# Patient Record
Sex: Male | Born: 2000 | Race: Black or African American | Hispanic: No | Marital: Single | Smoking: Never smoker
Health system: Southern US, Community
[De-identification: ages and names within clinical notes are randomized; demographics above are authoritative.]

## PROBLEM LIST (undated history)

## (undated) DIAGNOSIS — Z973 Presence of spectacles and contact lenses: Secondary | ICD-10-CM

## (undated) DIAGNOSIS — A6002 Herpesviral infection of other male genital organs: Secondary | ICD-10-CM

## (undated) DIAGNOSIS — S83206A Unspecified tear of unspecified meniscus, current injury, right knee, initial encounter: Secondary | ICD-10-CM

## (undated) DIAGNOSIS — L309 Dermatitis, unspecified: Secondary | ICD-10-CM

## (undated) DIAGNOSIS — S83207A Unspecified tear of unspecified meniscus, current injury, left knee, initial encounter: Secondary | ICD-10-CM

## (undated) HISTORY — PX: WISDOM TOOTH EXTRACTION: SHX21

---
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
## 2020-03-19 ENCOUNTER — Encounter (HOSPITAL_BASED_OUTPATIENT_CLINIC_OR_DEPARTMENT_OTHER): Payer: Self-pay | Admitting: Emergency Medicine

## 2020-03-19 ENCOUNTER — Other Ambulatory Visit: Payer: Self-pay

## 2020-03-19 ENCOUNTER — Emergency Department (HOSPITAL_BASED_OUTPATIENT_CLINIC_OR_DEPARTMENT_OTHER)
Admission: EM | Admit: 2020-03-19 | Discharge: 2020-03-19 | Disposition: A | Discharge status: Home / Self Care | Payer: Medicaid Other | Attending: Emergency Medicine | Admitting: Emergency Medicine

## 2020-03-19 ENCOUNTER — Emergency Department (HOSPITAL_BASED_OUTPATIENT_CLINIC_OR_DEPARTMENT_OTHER): Payer: Medicaid Other

## 2020-03-19 DIAGNOSIS — J069 Acute upper respiratory infection, unspecified: Secondary | ICD-10-CM | POA: Diagnosis not present

## 2020-03-19 DIAGNOSIS — R0789 Other chest pain: Secondary | ICD-10-CM | POA: Insufficient documentation

## 2020-03-19 DIAGNOSIS — R05 Cough: Secondary | ICD-10-CM | POA: Diagnosis present

## 2020-03-19 DIAGNOSIS — Z20822 Contact with and (suspected) exposure to covid-19: Secondary | ICD-10-CM | POA: Insufficient documentation

## 2020-03-19 LAB — CBC
HCT: 46.7 % (ref 39.0–52.0)
Hemoglobin: 15.3 g/dL (ref 13.0–17.0)
MCH: 28.5 pg (ref 26.0–34.0)
MCHC: 32.8 g/dL (ref 30.0–36.0)
MCV: 87 fL (ref 80.0–100.0)
Platelets: 237 10*3/uL (ref 150–400)
RBC: 5.37 MIL/uL (ref 4.22–5.81)
RDW: 11.7 % (ref 11.5–15.5)
WBC: 12.9 10*3/uL — ABNORMAL HIGH (ref 4.0–10.5)
nRBC: 0 % (ref 0.0–0.2)

## 2020-03-19 LAB — SARS CORONAVIRUS 2 BY RT PCR (HOSPITAL ORDER, PERFORMED IN ~~LOC~~ HOSPITAL LAB): SARS Coronavirus 2: NEGATIVE

## 2020-03-19 LAB — BASIC METABOLIC PANEL
Anion gap: 11 (ref 5–15)
BUN: 7 mg/dL (ref 6–20)
CO2: 24 mmol/L (ref 22–32)
Calcium: 9.2 mg/dL (ref 8.9–10.3)
Chloride: 102 mmol/L (ref 98–111)
Creatinine, Ser: 1.23 mg/dL (ref 0.61–1.24)
GFR calc Af Amer: >60 mL/min (ref >60)
GFR calc non Af Amer: >60 mL/min (ref >60)
Glucose, Bld: 96 mg/dL (ref 70–99)
Potassium: 3.5 mmol/L (ref 3.5–5.1)
Sodium: 137 mmol/L (ref 135–145)

## 2020-03-19 LAB — TROPONIN I (HIGH SENSITIVITY): Troponin I (High Sensitivity): 5 ng/L (ref <18)

## 2020-03-19 MED ORDER — ALBUTEROL SULFATE HFA 108 (90 BASE) MCG/ACT IN AERS: 1.0000 | INHALATION_SPRAY | Freq: Four times a day (QID) | RESPIRATORY_TRACT | 0 refills | Status: DC | PRN

## 2020-03-19 MED ORDER — ALBUTEROL SULFATE HFA 108 (90 BASE) MCG/ACT IN AERS
1.0000 | INHALATION_SPRAY | Freq: Four times a day (QID) | RESPIRATORY_TRACT | Status: DC | PRN
  Administered 2020-03-19: 2 via RESPIRATORY_TRACT
  Filled 2020-03-19: qty 6.7

## 2020-03-19 MED ORDER — BENZONATATE 100 MG PO CAPS: 100.0000 mg | ORAL_CAPSULE | Freq: Three times a day (TID) | ORAL | 0 refills | Status: DC | PRN

## 2020-03-19 NOTE — ED Provider Notes (Signed)
[facility] EMERGENCY DEPARTMENT Provider Note   CSN: [id number] Arrival date & time: 03/19/20  1739     History Chief Complaint  Patient presents with  . Chest Pain    Dominic Ramos is a 19 y.o. male.  HPI Patient is an 19 year old male with no pertinent medical history.  Patient states for the past 2 days he has been experiencing mild congestion, postnasal drip, intermittent cough.  This morning he took a decongestant as well as an antihistamine.  He states that he smoked some marijuana and then went to the fair.  When he came home he was watching TV with his girlfriend in bed and states that he is unsure if he passed out or went to sleep.  He states that she attempted to wake him up but was initially unsuccessful.  She was concerned that something was wrong with him.  He states that he then woke up and experienced severe, stabbing, central chest pain.  He cannot specify how long this occurred.  This then spontaneously resolved and he decided to go back to sleep.  No fevers, chills, abdominal pain, nausea, vomiting, leg swelling, recent immobilization, recent trauma, history of blood clot.    Past Medical History:  Diagnosis Date  . Eczema     There are no problems to display for this patient.   History reviewed. No pertinent surgical history.     No family history on file.  Social History   Tobacco Use  . Smoking status: Never Smoker  . Smokeless tobacco: Never Used  Substance Use Topics  . Alcohol use: Yes    Comment: occasionally  . Drug use: Never    Home Medications Prior to Admission medications   Medication Sig Start Date End Date Taking? Authorizing Provider  ibuprofen (ADVIL,MOTRIN) 600 MG tablet Take 1 tablet (600 mg total) by mouth every 6 (six) hours as needed for mild pain or moderate pain. 02/22/18   Loren Racer, MD  sodium chloride (OCEAN) 0.65 % SOLN nasal spray Place 1 spray into both nostrils as needed for congestion. 06/07/15   Hess,  Nada Boozer, PA-C    Allergies    Patient has no known allergies.  Review of Systems   Review of Systems  All other systems reviewed and are negative. Ten systems reviewed and are negative for acute change, except as noted in the HPI.    Physical Exam Updated Vital Signs BP 128/62 (BP Location: Right Arm)   Pulse 82   Resp (!) 21   Ht 6\' 4"  (1.93 m)   Wt 113.4 kg   SpO2 100%   BMI 30.43 kg/m   Physical Exam Vitals and nursing note reviewed.  Constitutional:      General: He is not in acute distress.    Appearance: Normal appearance. He is well-developed and normal weight. He is not ill-appearing, toxic-appearing or diaphoretic.  HENT:     Head: Normocephalic and atraumatic.     Right Ear: External ear normal.     Left Ear: External ear normal.     Nose: Nose normal.     Mouth/Throat:     Mouth: Mucous membranes are moist.     Pharynx: Oropharynx is clear. No oropharyngeal exudate or posterior oropharyngeal erythema.  Eyes:     Extraocular Movements: Extraocular movements intact.  Cardiovascular:     Rate and Rhythm: Normal rate and regular rhythm.     Pulses: Normal pulses.  Radial pulses are 2+ on the right side and 2+ on the left side.       Dorsalis pedis pulses are 2+ on the right side and 2+ on the left side.     Heart sounds: Normal heart sounds. Heart sounds not distant. No murmur heard.  No systolic murmur is present.  No friction rub. No gallop.   Pulmonary:     Effort: Pulmonary effort is normal. No tachypnea or respiratory distress.     Breath sounds: No stridor. Examination of the right-lower field reveals wheezing. Examination of the left-lower field reveals wheezing. Wheezing present. No decreased breath sounds, rhonchi or rales.  Abdominal:     General: Abdomen is flat.     Palpations: Abdomen is soft. There is no mass.     Tenderness: There is no abdominal tenderness. There is no guarding or rebound.  Musculoskeletal:        General: Normal  range of motion.     Cervical back: Normal range of motion and neck supple. No tenderness.     Right lower leg: No tenderness. No edema.     Left lower leg: No tenderness. No edema.     Comments: No leg swelling noted.  No calf tenderness.  Skin:    General: Skin is warm and dry.  Neurological:     General: No focal deficit present.     Mental Status: He is alert and oriented to person, place, and time.  Psychiatric:        Mood and Affect: Mood normal.        Behavior: Behavior normal.    ED Results / Procedures / Treatments   Labs (all labs ordered are listed, but only abnormal results are displayed) Labs Reviewed  CBC - Abnormal; Notable for the following components:      Result Value   WBC 12.9 (*)    All other components within normal limits  SARS CORONAVIRUS 2 BY RT PCR (HOSPITAL ORDER, PERFORMED IN [facility])  BASIC METABOLIC PANEL  TROPONIN I (HIGH SENSITIVITY)   EKG EKG Interpretation  Date/Time:  Sunday March 19 2020 17:51:26 EDT Ventricular Rate:  98 PR Interval:  132 QRS Duration: 92 QT Interval:  332 QTC Calculation: 423 R Axis:   84 Text Interpretation: Normal sinus rhythm Normal ECG No previous ECGs available Confirmed by Richardean Canal [phone]) on 03/19/2020 8:43:41 PM   Radiology DG Chest 2 View  Result Date: 03/19/2020 CLINICAL DATA:  Posterior right chest pain and shortness of EXAM: CHEST - 2 VIEW COMPARISON:  None. FINDINGS: The heart size and mediastinal contours are within normal limits. Both lungs are clear. The visualized skeletal structures are unremarkable. IMPRESSION: No active cardiopulmonary disease. Electronically Signed   By: Katherine Mantle M.D.   On: 03/19/2020 18:44    Procedures Procedures   Medications Ordered in ED Medications - No data to display  ED Course  I have reviewed the triage vital signs and the nursing notes.  Pertinent labs & imaging results that were available during my care of the patient were  reviewed by me and considered in my medical decision making (see chart for details).  Clinical Course as of Mar 19 2114  Wynelle Link Mar 19, 2020  2059 SARS Coronavirus 2: NEGATIVE [LJ]  2100 Troponin I (High Sensitivity): 5 [LJ]    Clinical Course User Index [LJ] Placido Sou, PA-C   MDM Rules/Calculators/A&P  Pt is a 19 y.o. male that presents with a history, physical exam, and ED Clinical Course as noted above.   Patient presents today due to an episode of chest pain that occurred this afternoon.  Basic cardiac work-up was obtained and was benign.  No elevation in troponin.  ECG is reassuring.  Chest x-ray is negative.  Patient does have symptoms consistent with a viral URI but his COVID-19 test was negative today.  He has not been vaccinated.  Patient did show mild leukocytosis of 12.9 but otherwise his CBC was reassuring.  Patient symptoms sound consistent with a viral URI.  He is not tachycardic or hypoxic.  Keddy criteria for DVT/PE rule out is 0.  Feel it is safe to discharge patient at this time.  He agrees.  Recommend that he follow-up with his PCP.  We will give patient a short course of Tessalon Perles as well as an albuterol inhaler for use as needed.  Patient is hemodynamically stable and in NAD at the time of d/c. Evaluation does not show pathology that would require ongoing emergent intervention or inpatient treatment. I explained the diagnosis to the patient. Patient is comfortable with above plan and is stable for discharge at this time. All questions were answered prior to disposition. Strict return precautions for returning to the ED were discussed. Encouraged follow up with PCP.    An After Visit Summary was printed and given to the patient.  Patient discharged to home/self care.  Condition at discharge: Stable  Note: Portions of this report may have been transcribed using voice recognition software. Every effort was made to ensure accuracy;  however, inadvertent computerized transcription errors may be present.   Final Clinical Impression(s) / ED Diagnoses Final diagnoses:  Atypical chest pain  Viral URI with cough   Rx / DC Orders ED Discharge Orders         Ordered    albuterol (VENTOLIN HFA) 108 (90 Base) MCG/ACT inhaler  Every 6 hours PRN        03/19/20 2124    benzonatate (TESSALON) 100 MG capsule  3 times daily PRN        03/19/20 2124           Placido Sou, PA-C 03/19/20 2126    Charlynne Pander, MD 03/20/20 [phone]

## 2020-03-19 NOTE — Discharge Instructions (Signed)
Please return to the ER with any new or worsening symptoms. It was a pleasure to meet you.  

## 2020-03-19 NOTE — ED Triage Notes (Signed)
Reports right chest pain with sob that started today after being told he had passed out.  Endorses head congestion, and cough.

## 2020-06-05 ENCOUNTER — Encounter: Payer: Self-pay | Admitting: Family Medicine

## 2020-06-05 ENCOUNTER — Ambulatory Visit
Admission: EM | Admit: 2020-06-05 | Discharge: 2020-06-05 | Disposition: A | Discharge status: Home / Self Care | Payer: Medicaid Other | Attending: Family Medicine | Admitting: Family Medicine

## 2020-06-05 ENCOUNTER — Other Ambulatory Visit: Payer: Self-pay

## 2020-06-05 DIAGNOSIS — J039 Acute tonsillitis, unspecified: Secondary | ICD-10-CM

## 2020-06-05 DIAGNOSIS — Z20822 Contact with and (suspected) exposure to covid-19: Secondary | ICD-10-CM

## 2020-06-05 LAB — POCT RAPID STREP A (OFFICE): Rapid Strep A Screen: NEGATIVE

## 2020-06-05 MED ORDER — PENICILLIN V POTASSIUM 500 MG PO TABS: 500.0000 mg | ORAL_TABLET | Freq: Three times a day (TID) | ORAL | 0 refills | Status: DC

## 2020-06-05 MED ORDER — CHLORHEXIDINE GLUCONATE 0.12 % MT SOLN: 15.0000 mL | Freq: Two times a day (BID) | OROMUCOSAL | 0 refills | Status: DC

## 2020-06-05 NOTE — ED Triage Notes (Signed)
Pt here for sore throat and some body aches x 4 days

## 2020-06-05 NOTE — ED Provider Notes (Addendum)
[facility]    CSN: [id number] Arrival date & time: 06/05/20  0849      History   Chief Complaint Chief Complaint  Patient presents with  . Sore Throat    HPI Dominic Ramos is a 19 y.o. male.   This is the initial [facility] patient visit for this 19 yo man complaining of sore throat and shortness of breath for the past 4 days.  Archivist.    No cough or loss of sense of smell.  Negative Covid test at school within 48 hours.     Past Medical History:  Diagnosis Date  . Eczema     There are no problems to display for this patient.   History reviewed. No pertinent surgical history.     Home Medications    Prior to Admission medications   Medication Sig Start Date End Date Taking? Authorizing Provider  albuterol (VENTOLIN HFA) 108 (90 Base) MCG/ACT inhaler Inhale 1-2 puffs into the lungs every 6 (six) hours as needed for wheezing or shortness of breath. 03/19/20   Placido Sou, PA-C  chlorhexidine (PERIDEX) 0.12 % solution Use as directed 15 mLs in the mouth or throat 2 (two) times daily. 06/05/20   Elvina Sidle, MD  ibuprofen (ADVIL,MOTRIN) 600 MG tablet Take 1 tablet (600 mg total) by mouth every 6 (six) hours as needed for mild pain or moderate pain. 02/22/18   Loren Racer, MD  penicillin v potassium (VEETID) 500 MG tablet Take 1 tablet (500 mg total) by mouth 3 (three) times daily. 06/05/20   Elvina Sidle, MD  sodium chloride (OCEAN) 0.65 % SOLN nasal spray Place 1 spray into both nostrils as needed for congestion. 06/07/15   Hess, Nada Boozer, PA-C    Family History History reviewed. No pertinent family history.  Social History Social History   Tobacco Use  . Smoking status: Never Smoker  . Smokeless tobacco: Never Used  Substance Use Topics  . Alcohol use: Yes    Comment: occasionally  . Drug use: Never     Allergies   Patient has no known allergies.   Review of Systems Review of Systems  HENT: Positive for sore  throat.      Physical Exam Triage Vital Signs ED Triage Vitals  Enc Vitals Group     BP      Pulse      Resp      Temp      Temp src      SpO2      Weight      Height      Head Circumference      Peak Flow      Pain Score      Pain Loc      Pain Edu?      Excl. in GC?    No data found.  Updated Vital Signs BP 129/79 (BP Location: Left Arm)   Pulse 86   Temp 99.2 F (37.3 C) (Oral)   Resp 20   SpO2 96%   Physical Exam Vitals and nursing note reviewed.  Constitutional:      General: He is not in acute distress.    Appearance: He is well-developed and normal weight.  HENT:     Right Ear: Tympanic membrane normal.     Left Ear: Tympanic membrane normal.     Mouth/Throat:     Mouth: Mucous membranes are moist.     Pharynx: Uvula midline. Pharyngeal swelling, oropharyngeal exudate and posterior  oropharyngeal erythema present.     Tonsils: Tonsillar exudate present.  Eyes:     Conjunctiva/sclera: Conjunctivae normal.     Pupils: Pupils are equal, round, and reactive to light.  Pulmonary:     Effort: Pulmonary effort is normal.  Musculoskeletal:     Cervical back: Normal range of motion and neck supple.  Lymphadenopathy:     Cervical: Cervical adenopathy present.  Skin:    General: Skin is warm and dry.  Neurological:     General: No focal deficit present.     Mental Status: He is alert.  Psychiatric:        Mood and Affect: Mood normal.        Behavior: Behavior normal.      UC Treatments / Results  Labs (all labs ordered are listed, but only abnormal results are displayed) Labs Reviewed  NOVEL CORONAVIRUS, NAA  CULTURE, GROUP A STREP [facility])  POCT RAPID STREP A (OFFICE)    EKG   Radiology No results found.  Procedures Procedures (including critical care time)  Medications Ordered in UC Medications - No data to display  Initial Impression / Assessment and Plan / UC Course  I have reviewed the triage vital signs and the nursing  notes.  Pertinent labs & imaging results that were available during my care of the patient were reviewed by me and considered in my medical decision making (see chart for details).    Final Clinical Impressions(s) / UC Diagnoses   Final diagnoses:  Encounter for screening laboratory testing for COVID-19 virus  Acute tonsillitis, unspecified etiology   Discharge Instructions   None    ED Prescriptions    Medication Sig Dispense Auth. Provider   penicillin v potassium (VEETID) 500 MG tablet Take 1 tablet (500 mg total) by mouth 3 (three) times daily. 30 tablet Elvina Sidle, MD   chlorhexidine (PERIDEX) 0.12 % solution Use as directed 15 mLs in the mouth or throat 2 (two) times daily. 120 mL Elvina Sidle, MD     I have reviewed the PDMP during this encounter.   Elvina Sidle, MD 06/05/20 0277    Elvina Sidle, MD 06/05/20 4128    Elvina Sidle, MD 06/05/20 [phone]

## 2020-06-06 LAB — CULTURE, GROUP A STREP (THRC)

## 2020-06-06 LAB — NOVEL CORONAVIRUS, NAA: SARS-CoV-2, NAA: NOT DETECTED

## 2020-06-06 LAB — SARS-COV-2, NAA 2 DAY TAT

## 2020-10-30 ENCOUNTER — Other Ambulatory Visit: Payer: Self-pay

## 2020-10-30 ENCOUNTER — Ambulatory Visit (HOSPITAL_COMMUNITY)
Admission: EM | Admit: 2020-10-30 | Discharge: 2020-10-30 | Disposition: A | Discharge status: Home / Self Care | Payer: Federal, State, Local not specified - PPO | Attending: Emergency Medicine | Admitting: Emergency Medicine

## 2020-10-30 ENCOUNTER — Encounter (HOSPITAL_COMMUNITY): Payer: Self-pay

## 2020-10-30 DIAGNOSIS — R300 Dysuria: Secondary | ICD-10-CM | POA: Insufficient documentation

## 2020-10-30 DIAGNOSIS — R319 Hematuria, unspecified: Secondary | ICD-10-CM | POA: Diagnosis not present

## 2020-10-30 DIAGNOSIS — Z113 Encounter for screening for infections with a predominantly sexual mode of transmission: Secondary | ICD-10-CM | POA: Diagnosis present

## 2020-10-30 LAB — POCT URINALYSIS DIPSTICK, ED / UC
Bilirubin Urine: NEGATIVE
Glucose, UA: NEGATIVE mg/dL
Hgb urine dipstick: NEGATIVE
Leukocytes,Ua: NEGATIVE
Nitrite: NEGATIVE
Protein, ur: NEGATIVE mg/dL
Specific Gravity, Urine: 1.02 (ref 1.005–1.030)
Urobilinogen, UA: >=8 mg/dL (ref 0.0–1.0)
pH: 8.5 — ABNORMAL HIGH (ref 5.0–8.0)

## 2020-10-30 MED ORDER — VALACYCLOVIR HCL 1 G PO TABS: 1000.0000 mg | ORAL_TABLET | Freq: Two times a day (BID) | ORAL | 0 refills | Status: AC

## 2020-10-30 MED ORDER — LIDOCAINE 5 % EX OINT: 1.0000 "application " | TOPICAL_OINTMENT | CUTANEOUS | 0 refills | Status: DC | PRN

## 2020-10-30 NOTE — Discharge Instructions (Addendum)
Your urine does not show any signs of infection.  We will contact you if the urine culture is positive.   Drink plenty of fluids, especially water.    Take the valacyclovir twice day for the next 10 days.  You can   We will contact you with the results from your lab work and any additional treatment.    Do not have sex while taking undergoing treatment for STI.  Make sure that all of your partners get tested and treated.   Use a condom or other barrier method for all sexual encounters.    Return or go to the Emergency Department if symptoms worsen or do not improve in the next few days.

## 2020-10-30 NOTE — ED Triage Notes (Signed)
Pt presents with c/o blood in urine and burning with urination, also unsure if std

## 2020-10-30 NOTE — ED Provider Notes (Signed)
[facility]    CSN: [id number] Arrival date & time: 10/30/20  1635      History   Chief Complaint Chief Complaint  Patient presents with  . Hematuria  . Dysuria    HPI Dominic Ramos is a 20 y.o. male.   Patient here for evaluation of dysuria and hematuria.  Reports symptoms started approximately 4 days ago.  Reports taking AZO which did help with burning.  Patient is sexually active with some condom use.  Denies any discharge or drainage from penis.  Denies any known contact with STI positive patient. Denies any trauma, injury, or other precipitating event. Denies any fevers, chest pain, shortness of breath, N/V/D, numbness, tingling, weakness, abdominal pain, or headaches.   ROS: As per HPI, all other pertinent ROS negative   The history is provided by the patient.    Past Medical History:  Diagnosis Date  . Eczema     There are no problems to display for this patient.   History reviewed. No pertinent surgical history.     Home Medications    Prior to Admission medications   Medication Sig Start Date End Date Taking? Authorizing Provider  lidocaine (XYLOCAINE) 5 % ointment Apply 1 application topically as needed. 10/30/20  Yes Ivette Loyal, NP  valACYclovir (VALTREX) 1000 MG tablet Take 1 tablet (1,000 mg total) by mouth 2 (two) times daily for 10 days. 10/30/20 11/09/20 Yes Ivette Loyal, NP  albuterol (VENTOLIN HFA) 108 (90 Base) MCG/ACT inhaler Inhale 1-2 puffs into the lungs every 6 (six) hours as needed for wheezing or shortness of breath. 03/19/20   Placido Sou, PA-C  chlorhexidine (PERIDEX) 0.12 % solution Use as directed 15 mLs in the mouth or throat 2 (two) times daily. 06/05/20   Elvina Sidle, MD  ibuprofen (ADVIL,MOTRIN) 600 MG tablet Take 1 tablet (600 mg total) by mouth every 6 (six) hours as needed for mild pain or moderate pain. 02/22/18   Loren Racer, MD  penicillin v potassium (VEETID) 500 MG tablet Take 1 tablet (500 mg total)  by mouth 3 (three) times daily. 06/05/20   Elvina Sidle, MD  sodium chloride (OCEAN) 0.65 % SOLN nasal spray Place 1 spray into both nostrils as needed for congestion. 06/07/15   Hess, Nada Boozer, PA-C    Family History Family History  Family history unknown: Yes    Social History Social History   Tobacco Use  . Smoking status: Never Smoker  . Smokeless tobacco: Never Used  Substance Use Topics  . Alcohol use: Yes    Comment: occasionally  . Drug use: Never     Allergies   Patient has no known allergies.   Review of Systems Review of Systems  Genitourinary: Positive for dysuria and hematuria. Negative for penile discharge, penile pain and penile swelling.     Physical Exam Triage Vital Signs ED Triage Vitals  Enc Vitals Group     BP 10/30/20 1726 (!) 126/54     Pulse Rate 10/30/20 1726 63     Resp 10/30/20 1726 18     Temp 10/30/20 1726 98.6 F (37 C)     Temp src --      SpO2 10/30/20 1726 98 %     Weight --      Height --      Head Circumference --      Peak Flow --      Pain Score 10/30/20 1725 2     Pain Loc --  Pain Edu? --      Excl. in GC? --    No data found.  Updated Vital Signs BP (!) 126/54   Pulse 63   Temp 98.6 F (37 C)   Resp 18   SpO2 98%   Visual Acuity Right Eye Distance:   Left Eye Distance:   Bilateral Distance:    Right Eye Near:   Left Eye Near:    Bilateral Near:     Physical Exam Vitals and nursing note reviewed.  Constitutional:      General: He is not in acute distress.    Appearance: Normal appearance. He is not ill-appearing, toxic-appearing or diaphoretic.  HENT:     Head: Normocephalic and atraumatic.  Eyes:     Conjunctiva/sclera: Conjunctivae normal.  Cardiovascular:     Rate and Rhythm: Normal rate.     Pulses: Normal pulses.  Pulmonary:     Effort: Pulmonary effort is normal.  Abdominal:     General: Abdomen is flat.  Genitourinary:    Penis: Lesions (multiple small ulcerative lesions to  penis and testicles) present.   Musculoskeletal:        General: Normal range of motion.     Cervical back: Normal range of motion.  Skin:    General: Skin is warm and dry.  Neurological:     General: No focal deficit present.     Mental Status: He is alert and oriented to person, place, and time.  Psychiatric:        Mood and Affect: Mood normal.      UC Treatments / Results  Labs (all labs ordered are listed, but only abnormal results are displayed) Labs Reviewed  POCT URINALYSIS DIPSTICK, ED / UC - Abnormal; Notable for the following components:      Result Value   Ketones, ur TRACE (*)    pH 8.5 (*)    All other components within normal limits  URINE CULTURE  HSV CULTURE AND TYPING  CYTOLOGY, (ORAL, ANAL, URETHRAL) ANCILLARY ONLY    EKG   Radiology No results found.  Procedures Procedures (including critical care time)  Medications Ordered in UC Medications - No data to display  Initial Impression / Assessment and Plan / UC Course  I have reviewed the triage vital signs and the nursing notes.  Pertinent labs & imaging results that were available during my care of the patient were reviewed by me and considered in my medical decision making (see chart for details).     Dysuria Urinalysis with no signs of infection.  Culture pending.  Encourage plenty of fluids especially water.  Screen for STD Self swab obtained, will treat based on results.  Obtained swab of lesions for HSV.  Assessment is concerning for herpes, will prescribe valacyclovir 1g BID for 10 days.  Lidocaine gel prescribed as needed for irritation and discomfort.  Discussed safe sex practices including condom or other barrier method use.  Follow up with PCP as needed.   Final Clinical Impressions(s) / UC Diagnoses   Final diagnoses:  Dysuria  Screen for STD (sexually transmitted disease)     Discharge Instructions     Your urine does not show any signs of infection.  We will contact you  if the urine culture is positive.   Drink plenty of fluids, especially water.    Take the valacyclovir twice day for the next 10 days.  You can   We will contact you with the results from your lab work and  any additional treatment.    Do not have sex while taking undergoing treatment for STI.  Make sure that all of your partners get tested and treated.   Use a condom or other barrier method for all sexual encounters.    Return or go to the Emergency Department if symptoms worsen or do not improve in the next few days.      ED Prescriptions    Medication Sig Dispense Auth. Provider   valACYclovir (VALTREX) 1000 MG tablet Take 1 tablet (1,000 mg total) by mouth 2 (two) times daily for 10 days. 20 tablet Chales Salmon R, NP   lidocaine (XYLOCAINE) 5 % ointment Apply 1 application topically as needed. 35.44 g Ivette Loyal, NP     PDMP not reviewed this encounter.   Ivette Loyal, NP 10/30/20 1900

## 2020-10-31 LAB — CYTOLOGY, (ORAL, ANAL, URETHRAL) ANCILLARY ONLY
Chlamydia: NEGATIVE
Comment: NEGATIVE
Comment: NEGATIVE
Comment: NORMAL
Neisseria Gonorrhea: NEGATIVE
Trichomonas: NEGATIVE

## 2020-11-01 LAB — URINE CULTURE: Culture: NO GROWTH

## 2020-11-02 LAB — HSV CULTURE AND TYPING

## 2020-12-08 ENCOUNTER — Encounter (HOSPITAL_BASED_OUTPATIENT_CLINIC_OR_DEPARTMENT_OTHER): Payer: Self-pay | Admitting: Orthopedic Surgery

## 2020-12-08 ENCOUNTER — Other Ambulatory Visit: Payer: Self-pay

## 2020-12-08 NOTE — Progress Notes (Signed)
Spoke w/ via phone for pre-op interview--- Pt Lab needs dos---- no              Lab results------ no COVID test -----patient states asymptomatic no test needed Arrive at ------- 0930 on 12-12-2020 NPO after MN NO Solid Food.  Clear liquids from MN until--- 0830 Med rec completed Medications to take morning of surgery ----- NONE Diabetic medication ----- n/a Patient instructed no nail polish to be worn day of surgery Patient instructed to bring photo id and insurance card day of surgery Patient aware to have Driver (ride ) / caregiver   for 24 hours after surgery -- mother, Dominic Ramos Patient Special Instructions ----- n/a Pre-Op special Istructions ----- n/a Patient verbalized understanding of instructions that were given at this phone interview. Patient denies shortness of breath, chest pain, fever, cough at this phone interview.

## 2020-12-08 NOTE — H&P (Signed)
PREOPERATIVE H&P  Chief Complaint: RIGHT KNEE LATERAL MENISCUS TEAR  HPI: Dominic Ramos is a 20 y.o. male who presents with a diagnosis of RIGHT KNEE LATERAL MENISCUS TEAR. 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 He says it will often swell and feels unstable. Symptoms are rated as moderate to severe, and have been worsening.  This is significantly impairing activities of daily living.  He has elected for surgical management.   Past Medical History:  Diagnosis Date  . Eczema    No past surgical history on file. Social History   Socioeconomic History  . Marital status: Single    Spouse name: Not on file  . Number of children: Not on file  . Years of education: Not on file  . Highest education level: Not on file  Occupational History  . Not on file  Tobacco Use  . Smoking status: Never Smoker  . Smokeless tobacco: Never Used  Substance and Sexual Activity  . Alcohol use: Yes    Comment: occasionally  . Drug use: Never  . Sexual activity: Never  Other Topics Concern  . Not on file  Social History Narrative  . Not on file   Social Determinants of Health   Financial Resource Strain: Not on file  Food Insecurity: Not on file  Transportation Needs: Not on file  Physical Activity: Not on file  Stress: Not on file  Social Connections: Not on file   Family History  Family history unknown: Yes   No Known Allergies Prior to Admission medications   Medication Sig Start Date End Date Taking? Authorizing Provider  albuterol (VENTOLIN HFA) 108 (90 Base) MCG/ACT inhaler Inhale 1-2 puffs into the lungs every 6 (six) hours as needed for wheezing or shortness of breath. 03/19/20   Placido Sou, PA-C  chlorhexidine (PERIDEX) 0.12 % solution Use as directed 15 mLs in the mouth or throat 2 (two) times daily. 06/05/20   Elvina Sidle, MD  ibuprofen (ADVIL,MOTRIN) 600 MG tablet Take 1 tablet (600 mg total) by mouth every 6 (six) hours as needed for mild pain  or moderate pain. 02/22/18   Loren Racer, MD  lidocaine (XYLOCAINE) 5 % ointment Apply 1 application topically as needed. 10/30/20   Ivette Loyal, NP  penicillin v potassium (VEETID) 500 MG tablet Take 1 tablet (500 mg total) by mouth 3 (three) times daily. 06/05/20   Elvina Sidle, MD  sodium chloride (OCEAN) 0.65 % SOLN nasal spray Place 1 spray into both nostrils as needed for congestion. 06/07/15   Hess, Nada Boozer, PA-C     Positive ROS: All other systems have been reviewed and were otherwise negative with the exception of those mentioned in the HPI and as above.  Physical Exam: General: Alert, no acute distress Cardiovascular: No pedal edema Respiratory: No cyanosis, no use of accessory musculature GI: No organomegaly, abdomen is soft and non-tender Skin: No lesions in the area of chief complaint Neurologic: Sensation intact distally Psychiatric: Patient is competent for consent with normal mood and affect Lymphatic: No axillary or cervical lymphadenopathy  MUSCULOSKELETAL: + JLT, healed incisions from prior knee scope, moderate effusion, full ROM, stable ligament exam  Assessment: RIGHT KNEE LATERAL MENISCUS TEAR  Plan: Plan for Procedure(s): RIGHT KNEE ARTHROSCOPY WITH LATERAL MENISECTOMY  The risks benefits and alternatives were discussed with the patient including but not limited to the risks of nonoperative treatment, versus surgical intervention including infection, bleeding, nerve injury,  blood clots, cardiopulmonary complications, morbidity, mortality, among  others, and they were willing to proceed.   Weightbearing: WBAT RLE Orthopedic devices: none Showering: POD 3 Dressing: Reinforce as needed Medicines: Norco 10, Tylenol, Mobic, Zofran  Discharge: home Follow up: 2 weeks    Marzetta Board Office [phone] 12/08/2020 2:08 PM

## 2020-12-12 ENCOUNTER — Ambulatory Visit (HOSPITAL_BASED_OUTPATIENT_CLINIC_OR_DEPARTMENT_OTHER)
Admission: RE | Admit: 2020-12-12 | Payer: Federal, State, Local not specified - PPO | Source: Home / Self Care | Admitting: Orthopedic Surgery

## 2020-12-12 HISTORY — DX: Unspecified tear of unspecified meniscus, current injury, left knee, initial encounter: S83.207A

## 2020-12-12 HISTORY — DX: Unspecified tear of unspecified meniscus, current injury, right knee, initial encounter: S83.206A

## 2020-12-12 HISTORY — DX: Presence of spectacles and contact lenses: Z97.3

## 2020-12-12 SURGERY — ARTHROSCOPY, KNEE, WITH LATERAL MENISCECTOMY
Anesthesia: Choice | Site: Knee | Laterality: Right

## 2021-01-16 ENCOUNTER — Ambulatory Visit (HOSPITAL_BASED_OUTPATIENT_CLINIC_OR_DEPARTMENT_OTHER): Admit: 2021-01-16 | Payer: Federal, State, Local not specified - PPO | Admitting: Orthopedic Surgery

## 2021-01-16 ENCOUNTER — Encounter (HOSPITAL_BASED_OUTPATIENT_CLINIC_OR_DEPARTMENT_OTHER): Payer: Self-pay

## 2021-01-16 SURGERY — ARTHROSCOPY, KNEE, WITH MEDIAL MENISCECTOMY
Anesthesia: Choice | Site: Knee | Laterality: Left

## 2022-01-23 IMAGING — CR DG CHEST 2V
2 series · 2 of 2 positions shown · non-contrast
Comparison: None.

CLINICAL DATA: Posterior right chest pain and shortness of

EXAM:
CHEST - 2 VIEW

[w chest pa]
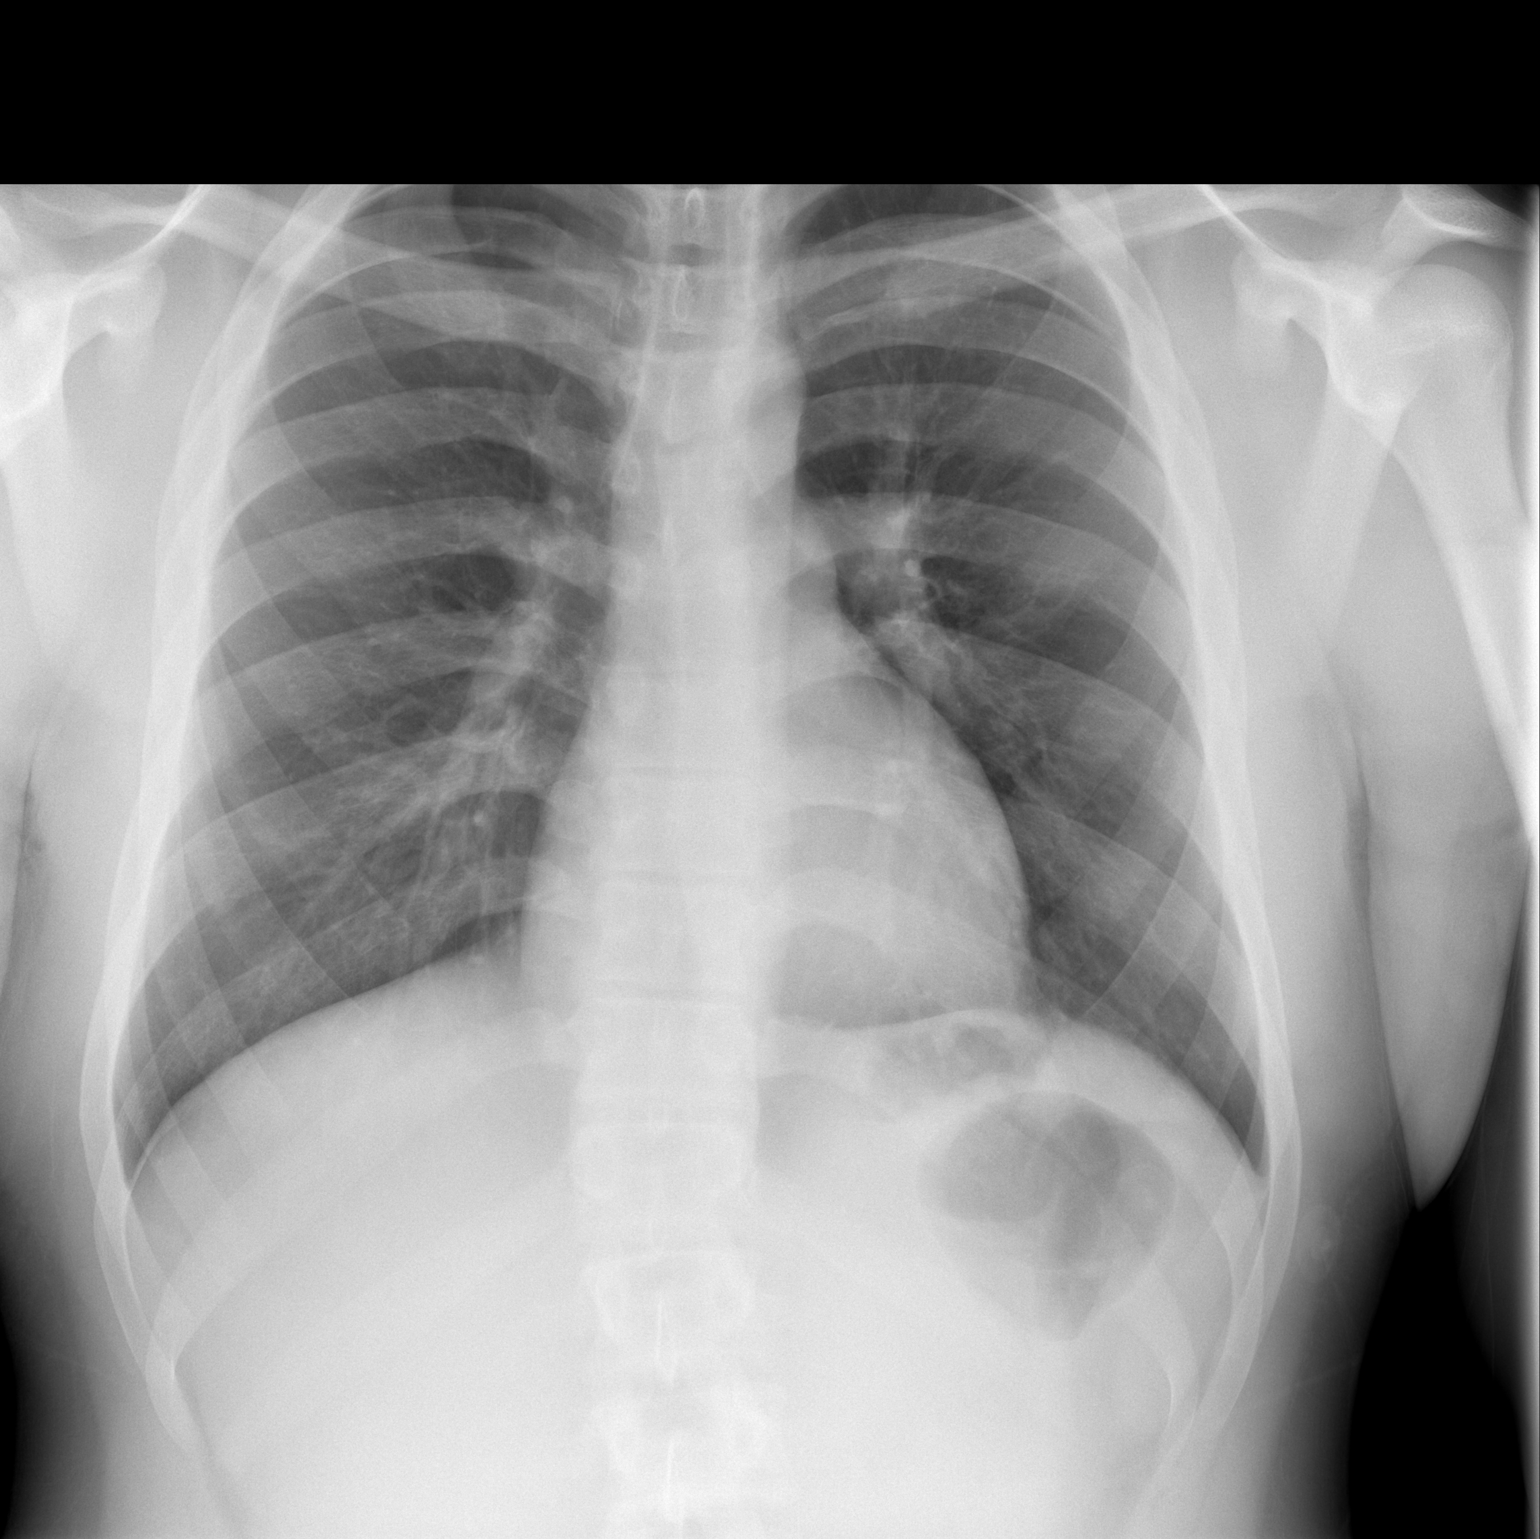

[w chest lat]
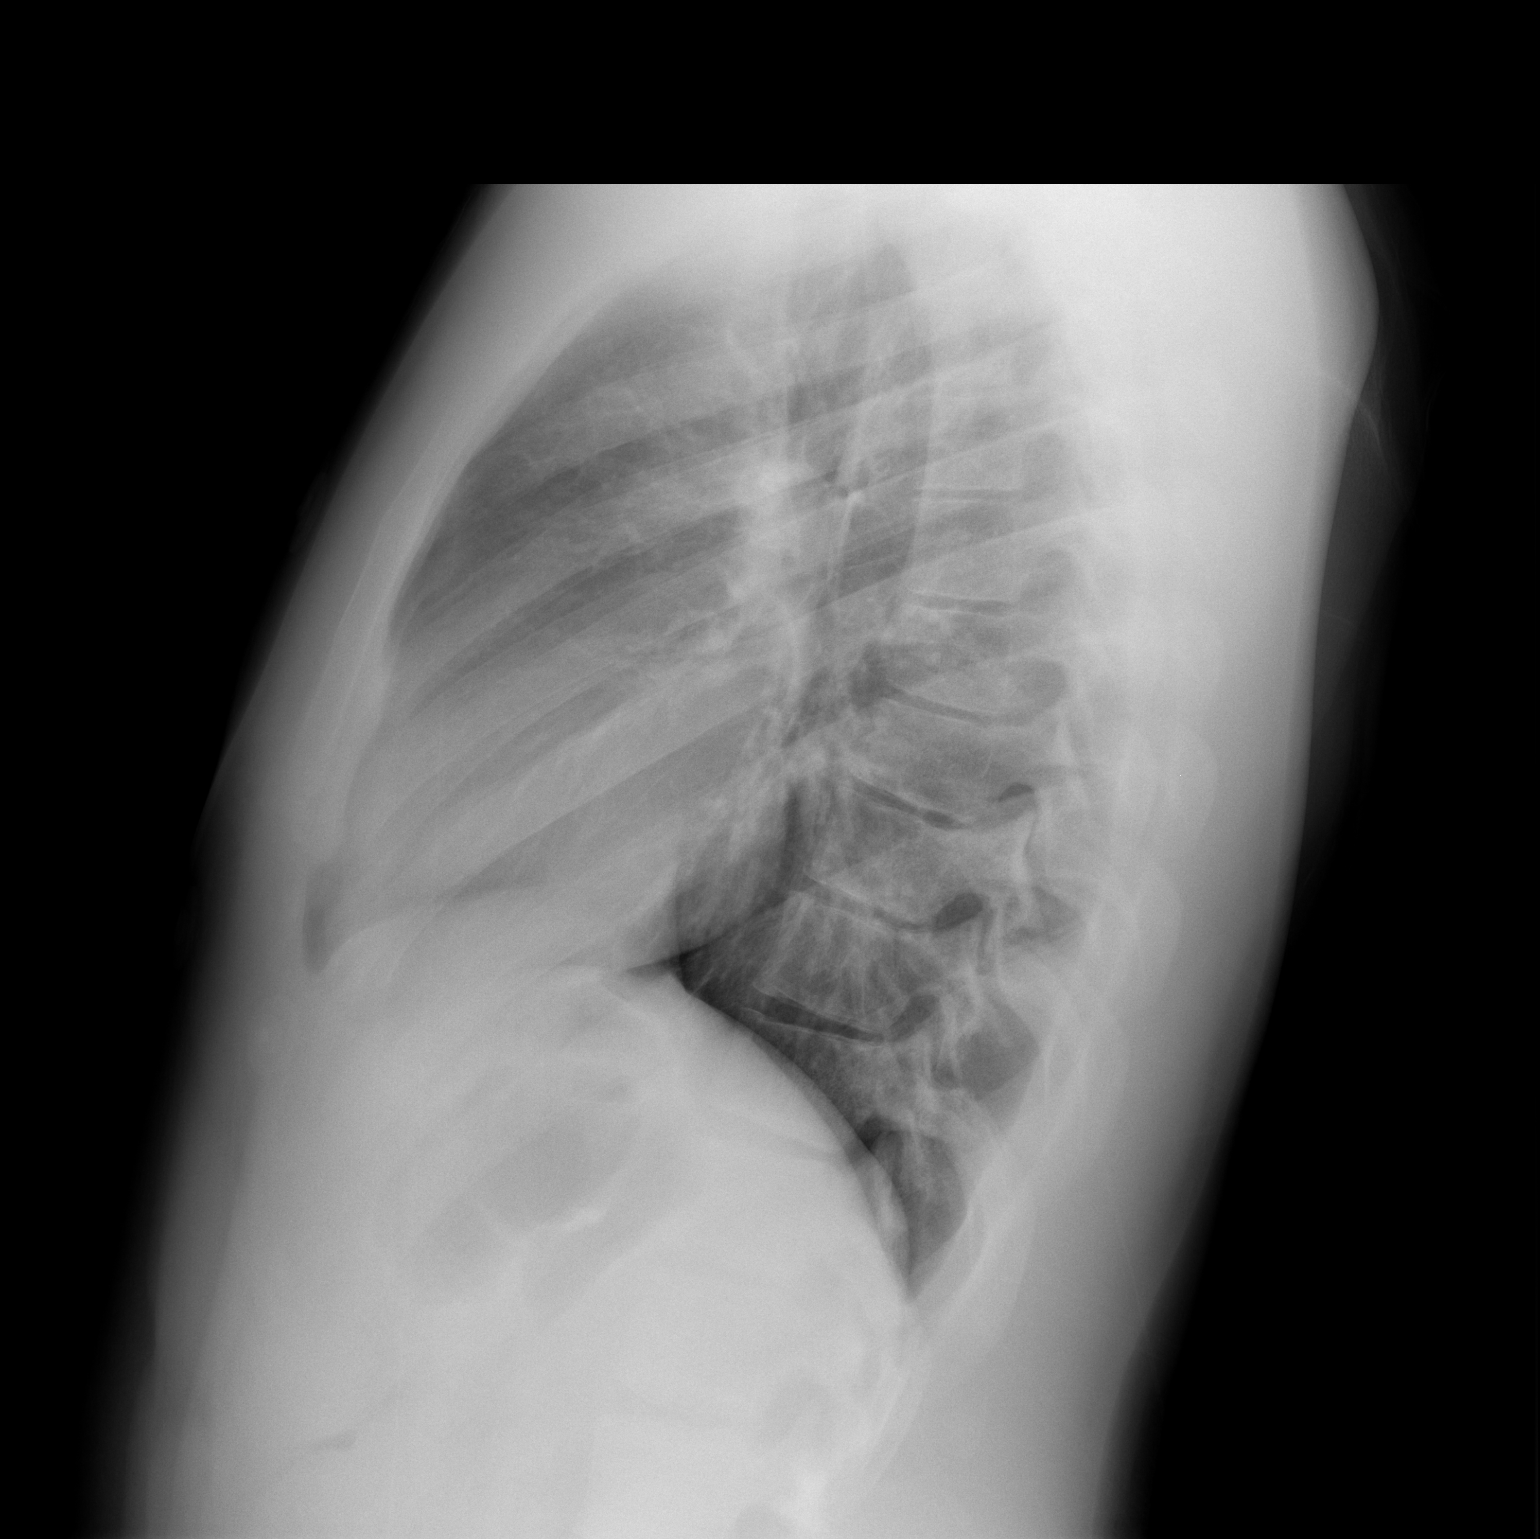

[2 of 2 positions shown; findings below may reference images not displayed]

FINDINGS: The heart size and mediastinal contours are within normal limits.
Both lungs are clear. The visualized skeletal structures are
unremarkable.
IMPRESSION: No active cardiopulmonary disease.

## 2022-05-29 ENCOUNTER — Ambulatory Visit
Admission: EM | Admit: 2022-05-29 | Discharge: 2022-05-29 | Discharge status: Against Medical Advice | Payer: Federal, State, Local not specified - PPO

## 2022-05-29 NOTE — ED Notes (Signed)
Per Patient Access, pt came in from waiting in vehicle, stating he was going to leave.

## 2023-01-05 ENCOUNTER — Emergency Department (HOSPITAL_COMMUNITY)
Admission: EM | Admit: 2023-01-05 | Discharge: 2023-01-05 | Disposition: A | Discharge status: Home / Self Care | Payer: Federal, State, Local not specified - PPO | Attending: Emergency Medicine | Admitting: Emergency Medicine

## 2023-01-05 ENCOUNTER — Other Ambulatory Visit: Payer: Self-pay

## 2023-01-05 ENCOUNTER — Encounter (HOSPITAL_COMMUNITY): Payer: Self-pay

## 2023-01-05 DIAGNOSIS — K0889 Other specified disorders of teeth and supporting structures: Secondary | ICD-10-CM | POA: Diagnosis present

## 2023-01-05 DIAGNOSIS — Z9289 Personal history of other medical treatment: Secondary | ICD-10-CM

## 2023-01-05 LAB — CBC WITH DIFFERENTIAL/PLATELET
Abs Immature Granulocytes: 0.01 10*3/uL (ref 0.00–0.07)
Basophils Absolute: 0 10*3/uL (ref 0.0–0.1)
Basophils Relative: 1 %
Eosinophils Absolute: 0.5 10*3/uL (ref 0.0–0.5)
Eosinophils Relative: 10 %
HCT: 39.4 % (ref 39.0–52.0)
Hemoglobin: 12.9 g/dL — ABNORMAL LOW (ref 13.0–17.0)
Immature Granulocytes: 0 %
Lymphocytes Relative: 45 %
Lymphs Abs: 2.2 10*3/uL (ref 0.7–4.0)
MCH: 28.5 pg (ref 26.0–34.0)
MCHC: 32.7 g/dL (ref 30.0–36.0)
MCV: 87 fL (ref 80.0–100.0)
Monocytes Absolute: 0.5 10*3/uL (ref 0.1–1.0)
Monocytes Relative: 10 %
Neutro Abs: 1.7 10*3/uL (ref 1.7–7.7)
Neutrophils Relative %: 34 %
Platelets: 200 10*3/uL (ref 150–400)
RBC: 4.53 MIL/uL (ref 4.22–5.81)
RDW: 12.1 % (ref 11.5–15.5)
WBC: 4.8 10*3/uL (ref 4.0–10.5)
nRBC: 0 % (ref 0.0–0.2)

## 2023-01-05 LAB — BASIC METABOLIC PANEL
Anion gap: 8 (ref 5–15)
BUN: 18 mg/dL (ref 6–20)
CO2: 24 mmol/L (ref 22–32)
Calcium: 8.8 mg/dL — ABNORMAL LOW (ref 8.9–10.3)
Chloride: 106 mmol/L (ref 98–111)
Creatinine, Ser: 1.36 mg/dL — ABNORMAL HIGH (ref 0.61–1.24)
GFR, Estimated: >60 mL/min (ref >60)
Glucose, Bld: 95 mg/dL (ref 70–99)
Potassium: 4.2 mmol/L (ref 3.5–5.1)
Sodium: 138 mmol/L (ref 135–145)

## 2023-01-05 MED ORDER — HYDROCODONE-ACETAMINOPHEN 5-325 MG PO TABS
1.0000 | ORAL_TABLET | Freq: Once | ORAL | Status: AC
  Administered 2023-01-05: 1 via ORAL
  Filled 2023-01-05: qty 1

## 2023-01-05 MED ORDER — HYDROCODONE-ACETAMINOPHEN 5-325 MG PO TABS: 1.0000 | ORAL_TABLET | ORAL | 0 refills | Status: DC | PRN

## 2023-01-05 NOTE — ED Provider Triage Note (Signed)
Emergency Medicine Provider Triage Evaluation Note  Call[place]ay BENICIO LARRANAGA , a 22 y.o. male  was evaluated in triage.  Pt complains of pain from dental surgery.  Review of Systems  Positive: pain Negative: Difficulty breathing  Physical Exam  BP (!) 145/90 (BP Location: Right Arm)   Pulse 68   Temp 97.9 F (36.6 C) (Oral)   Resp 19   SpO2 99%  Gen:   Awake, no distress   Resp:  Normal effort, no stridor MSK:   Moves extremities without difficulty  Other:  Minimal right face facial swelling  Medical Decision Making  Medically screening exam initiated at 7:29 PM.  Appropriate orders placed.  Deontre AVIYON RINELLA was informed that the remainder of the evaluation will be completed by another provider, this initial triage assessment does not replace that evaluation, and the importance of remaining in the ED until their evaluation is complete.     Lonell Grandchild, MD 01/05/23 [phone]

## 2023-01-05 NOTE — ED Notes (Addendum)
Charted wrong patient

## 2023-01-05 NOTE — Discharge Instructions (Addendum)
You are seen emergency department today for dental pain.  The symptoms that you are describing do sound consistent with dry socket.  I have sent some pain medication to your pharmacy that you can take as needed until he can follow-up with the dentist.  Please give them a call first thing tomorrow morning.  Continue to monitor how you're doing and return to the ER for new or worsening symptoms such as fever, difficulty breathing, or difficulty swallowing.

## 2023-01-05 NOTE — ED Notes (Signed)
Patient to bed ambulatory with steady gait c/o right sided oral pain and swelling x 1 week. Pt a/o x 4 respirations even and non labored.  Patient currently drinking water. Patient does have swelling to right lower jaw and cheek area.

## 2023-01-05 NOTE — ED Triage Notes (Signed)
Pt arrived from home via POV c/o dental pain on right side and swelling making it hard to take a breath.

## 2023-01-05 NOTE — ED Notes (Signed)
Seen in triage by Doreene Eland, MD and cleared to go to lobby.

## 2023-01-05 NOTE — ED Provider Notes (Signed)
[place] EMERGENCY DEPARTMENT AT [facility] Provider Note   CSN: [id number] Arrival date & time: 01/05/23  1918     History  Chief Complaint  Patient presents with   Dental Pain   Oral Swelling    Dominic Ramos is a 22 y.o. male with no significant past medical history who presents the emergency department complaining of dental pain.  States that he just had his right lower molar removed, and having worsening pain on the right side.  Has some associated right-sided facial swelling.  He states that he slept fine the night of the surgery, which was 4 days ago.  Ever since then he has been in severe pain, making it very difficult to sleep.  He feels that he is in so much pain it is causing him to hyperventilate.  He was not able to pick up the prescribed pain medication that the dentist sent him.  His mom was concerned that he has developed a dry socket.  He denies any fever or difficulty swallowing.  Does have some pain with opening his mouth.   Dental Pain      Home Medications Prior to Admission medications   Medication Sig Start Date End Date Taking? Authorizing Provider  HYDROcodone-acetaminophen (NORCO/VICODIN) 5-325 MG tablet Take 1-2 tablets by mouth every 4 (four) hours as needed. 01/05/23  Yes Toluwanimi Radebaugh T, PA-C  Ascorbic Acid (VITAMIN C PO) Take by mouth daily.    [provider]  Multiple Vitamins-Minerals (ZINC PO) Take by mouth daily.    [provider]      Allergies    Patient has no known allergies.    Review of Systems   Review of Systems  HENT:  Positive for dental problem.   All other systems reviewed and are negative.   Physical Exam Updated Vital Signs BP (!) 145/90 (BP Location: Right Arm)   Pulse 68   Temp 97.9 F (36.6 C) (Oral)   Resp 19   Ht 6\' 1"  (1.854 m)   Wt 101 kg   SpO2 99%   BMI 29.38 kg/m  Physical Exam Vitals and nursing note reviewed.  Constitutional:      Appearance: Normal appearance.   HENT:     Head: Normocephalic and atraumatic.     Comments: Mild right lower jaw swelling    Mouth/Throat:     Lips: Pink.     Dentition: Abnormal dentition.     Comments: Hole in gum to right posterior molar where tooth was removed, no apical abscess, no tonsillar swelling. Tolerating own secretions. 2 finger trismus.  Eyes:     Conjunctiva/sclera: Conjunctivae normal.  Neck:     Comments: Mild right submandibular swelling Pulmonary:     Effort: Pulmonary effort is normal. No respiratory distress.  Skin:    General: Skin is warm and dry.  Neurological:     Mental Status: He is alert.  Psychiatric:        Mood and Affect: Mood normal.        Behavior: Behavior normal.     ED Results / Procedures / Treatments   Labs (all labs ordered are listed, but only abnormal results are displayed) Labs Reviewed  BASIC METABOLIC PANEL - Abnormal; Notable for the following components:      Result Value   Creatinine, Ser 1.36 (*)    Calcium 8.8 (*)    All other components within normal limits  CBC WITH DIFFERENTIAL/PLATELET - Abnormal; Notable for the following components:  Hemoglobin 12.9 (*)    All other components within normal limits    EKG None  Radiology No results found.  Procedures Procedures    Medications Ordered in ED Medications  HYDROcodone-acetaminophen (NORCO/VICODIN) 5-325 MG per tablet 1 tablet (1 tablet Oral Given 01/05/23 1936)    ED Course/ Medical Decision Making/ A&P                             Medical Decision Making  This patient is a 22 y.o. male who presents to the ED for concern of dental pain after right lower molar removal 4 days ago.   Differential diagnoses prior to evaluation: Dental abscess, dental caries, dental fracture, PTA, ludwig's angina  Past Medical History / Social History / Additional history: Chart reviewed. Pertinent results include: no significant PMH  PDMP reviewed, consistent with patient report.   Physical  Exam: Physical exam performed. The pertinent findings include: Evidence of right lower molar excision.  No significant gingival swelling, abscess, tonsillar swelling.  Mild right lower jaw facial swelling, and mild right submandibular swelling.  No sublingual swelling.  Disposition: After consideration of the diagnostic results and the patients response to treatment, I feel that emergency department workup does not suggest an emergent condition requiring admission or immediate intervention beyond what has been performed at this time. The plan is: discharge to home with prescribed pain medication and strongly encourage follow-up with dentist tomorrow.  Patient plans to call his oral surgeon. The patient is safe for discharge and has been instructed to return immediately for worsening symptoms, change in symptoms or any other concerns.   Final Clinical Impression(s) / ED Diagnoses Final diagnoses:  History of dental surgery  Pain, dental    Rx / DC Orders ED Discharge Orders          Ordered    HYDROcodone-acetaminophen (NORCO/VICODIN) 5-325 MG tablet  Every 4 hours PRN        01/05/23 2019           Portions of this report may have been transcribed using voice recognition software. Every effort was made to ensure accuracy; however, inadvertent computerized transcription errors may be present.    Jeanella Flattery 01/05/23 2023    Ernie Avena, MD 01/05/23 2057

## 2023-02-05 ENCOUNTER — Emergency Department (HOSPITAL_COMMUNITY)
Admission: EM | Admit: 2023-02-05 | Discharge: 2023-02-06 | Discharge status: Against Medical Advice | Payer: Federal, State, Local not specified - PPO | Attending: Emergency Medicine | Admitting: Emergency Medicine

## 2023-02-05 DIAGNOSIS — Z5321 Procedure and treatment not carried out due to patient leaving prior to being seen by health care provider: Secondary | ICD-10-CM | POA: Insufficient documentation

## 2023-02-05 DIAGNOSIS — S01111A Laceration without foreign body of right eyelid and periocular area, initial encounter: Secondary | ICD-10-CM | POA: Diagnosis not present

## 2023-02-05 DIAGNOSIS — Y9367 Activity, basketball: Secondary | ICD-10-CM | POA: Diagnosis not present

## 2023-02-05 DIAGNOSIS — W500XXA Accidental hit or strike by another person, initial encounter: Secondary | ICD-10-CM | POA: Diagnosis not present

## 2023-02-05 DIAGNOSIS — S0993XA Unspecified injury of face, initial encounter: Secondary | ICD-10-CM | POA: Diagnosis present

## 2023-02-06 ENCOUNTER — Encounter (HOSPITAL_COMMUNITY): Payer: Self-pay | Admitting: Emergency Medicine

## 2023-02-06 ENCOUNTER — Other Ambulatory Visit: Payer: Self-pay

## 2023-02-06 NOTE — ED Provider Notes (Signed)
Patient has eloped from the ED prior to my arrival to the bedside.  This chart was dictated using voice recognition software, Dragon. Despite the best efforts of this provider to proofread and correct errors, errors may still occur which can change documentation meaning.    Sherrilee Gilles 02/06/23 0226    Glynn Octave, MD 02/06/23 0500

## 2023-02-06 NOTE — ED Triage Notes (Signed)
Patient with lac to R face at eyebrow from playing basketball tonight. A player came down and landed on him. Denies LOC. Bleeding controlled from lac in triage. Not UTD on tetanus.

## 2023-02-06 NOTE — ED Notes (Signed)
Pt has not returned to stretcher at this time; PA notified of same.

## 2023-02-06 NOTE — ED Notes (Addendum)
Pt seen walking around corner from hallway bed towards lobby with steady gait.  Pt did not alert staff of departure.

## 2023-04-09 ENCOUNTER — Ambulatory Visit
Admission: RE | Admit: 2023-04-09 | Discharge: 2023-04-09 | Disposition: A | Discharge status: Home / Self Care | Payer: Federal, State, Local not specified - PPO | Source: Ambulatory Visit | Attending: Physician Assistant | Admitting: Physician Assistant

## 2023-04-09 VITALS — BP 113/63 | HR 60 | Temp 97.9°F | Resp 16 | Ht 76.0 in | Wt 205.0 lb

## 2023-04-09 DIAGNOSIS — Z202 Contact with and (suspected) exposure to infections with a predominantly sexual mode of transmission: Secondary | ICD-10-CM | POA: Diagnosis not present

## 2023-04-09 DIAGNOSIS — Z113 Encounter for screening for infections with a predominantly sexual mode of transmission: Secondary | ICD-10-CM | POA: Diagnosis present

## 2023-04-09 HISTORY — DX: Herpesviral infection of other male genital organs: A60.02

## 2023-04-09 MED ORDER — DOXYCYCLINE HYCLATE 100 MG PO CAPS: 100.0000 mg | ORAL_CAPSULE | Freq: Two times a day (BID) | ORAL | 0 refills | Status: AC

## 2023-04-09 NOTE — ED Provider Notes (Signed)
[facility]    CSN: [id number] Arrival date & time: 04/09/23  1354      History   Chief Complaint Chief Complaint  Patient presents with   SEXUALLY TRANSMITTED DISEASE    HPI Dominic Ramos is a 22 y.o. male.   Patient here today for STD screening.  He notes that his recent male partner told him she tested positive for chlamydia.  He denies any symptoms himself including discharge, genital lesions, or pain.  He declines blood work today for screening for HIV or syphilis.  The history is provided by the patient.    Past Medical History:  Diagnosis Date   Acute meniscal tear of left knee    Herpes genitalis in men    Right knee meniscal tear    Wears glasses     Patient Active Problem List   Diagnosis Date Noted   Gynecomastia 02/07/2014    Past Surgical History:  Procedure Laterality Date   KNEE ARTHROSCOPY Right 2020   WISDOM TOOTH EXTRACTION         Home Medications    Prior to Admission medications   Medication Sig Start Date End Date Taking? Authorizing Provider  doxycycline (VIBRAMYCIN) 100 MG capsule Take 1 capsule (100 mg total) by mouth 2 (two) times daily. 04/09/23  Yes Tomi Bamberger, PA-C  ibuprofen (ADVIL) 800 MG tablet Take 800 mg by mouth every 8 (eight) hours as needed. 01/02/23  Yes [provider]  valACYclovir (VALTREX) 1000 MG tablet Take 1,000 mg by mouth as needed. Last dose: 04-09-2023, AM.   Yes [provider]  cetirizine (ZYRTEC) 10 MG tablet Take 10 mg by mouth daily.    [provider]  Triamcinolone Acetonide (NASACORT AQ NA) Place into the nose.    [provider]    Family History Family History  Family history unknown: Yes    Social History Social History   Tobacco Use   Smoking status: Never   Smokeless tobacco: Never  Vaping Use   Vaping status: Former  Substance Use Topics   Alcohol use: Yes    Comment: occasionally   Drug use: Never     Allergies   Patient  has no known allergies.   Review of Systems Review of Systems  Constitutional:  Negative for chills and fever.  Eyes:  Negative for discharge and redness.  Genitourinary:  Negative for dysuria, genital sores and penile discharge.  Neurological:  Negative for numbness.     Physical Exam Triage Vital Signs ED Triage Vitals  Encounter Vitals Group     BP 04/09/23 1406 113/63     Systolic BP Percentile --      Diastolic BP Percentile --      Pulse Rate 04/09/23 1406 (!) 58     Resp 04/09/23 1406 16     Temp 04/09/23 1406 97.9 F (36.6 C)     Temp Source 04/09/23 1406 Oral     SpO2 04/09/23 1406 97 %     Weight 04/09/23 1402 205 lb (93 kg)     Height 04/09/23 1402 6\' 4"  (1.93 m)     Head Circumference --      Peak Flow --      Pain Score 04/09/23 1400 0     Pain Loc --      Pain Education --      Exclude from Growth Chart --    No data found.  Updated Vital Signs BP 113/63 (BP Location: Right  Arm)   Pulse 60   Temp 97.9 F (36.6 C) (Oral)   Resp 16   Ht 6\' 4"  (1.93 m)   Wt 205 lb (93 kg)   SpO2 97%   BMI 24.95 kg/m   Physical Exam Vitals and nursing note reviewed.  Constitutional:      General: He is not in acute distress.    Appearance: Normal appearance. He is not ill-appearing.  HENT:     Head: Normocephalic and atraumatic.  Eyes:     Conjunctiva/sclera: Conjunctivae normal.  Cardiovascular:     Rate and Rhythm: Normal rate.  Pulmonary:     Effort: Pulmonary effort is normal. No respiratory distress.  Neurological:     Mental Status: He is alert.  Psychiatric:        Mood and Affect: Mood normal.        Behavior: Behavior normal.        Thought Content: Thought content normal.      UC Treatments / Results  Labs (all labs ordered are listed, but only abnormal results are displayed) Labs Reviewed  CYTOLOGY, (ORAL, ANAL, URETHRAL) ANCILLARY ONLY    EKG   Radiology No results found.  Procedures Procedures (including critical care  time)  Medications Ordered in UC Medications - No data to display  Initial Impression / Assessment and Plan / UC Course  I have reviewed the triage vital signs and the nursing notes.  Pertinent labs & imaging results that were available during my care of the patient were reviewed by me and considered in my medical decision making (see chart for details).    Doxycycline prescribed to cover possible chlamydia exposure.  STD screening ordered.  Will await results further recommendation.  Final Clinical Impressions(s) / UC Diagnoses   Final diagnoses:  Screening examination for STD (sexually transmitted disease)   Discharge Instructions   None    ED Prescriptions     Medication Sig Dispense Auth. Provider   doxycycline (VIBRAMYCIN) 100 MG capsule Take 1 capsule (100 mg total) by mouth 2 (two) times daily. 20 capsule Tomi Bamberger, PA-C      PDMP not reviewed this encounter.   Tomi Bamberger, PA-C 04/09/23 1435

## 2023-04-09 NOTE — ED Triage Notes (Signed)
Want to get tested - Entered by patient  Additional details: "I know I had herpes about 2 yrs ago but I was with a girl that tested positive for Chlamydia". No symptoms, No discharge. No pain. Voids "normal".

## 2023-04-14 LAB — CYTOLOGY, (ORAL, ANAL, URETHRAL) ANCILLARY ONLY
Chlamydia: POSITIVE — AB
Comment: NEGATIVE
Comment: NEGATIVE
Comment: NORMAL
Neisseria Gonorrhea: NEGATIVE
Trichomonas: NEGATIVE

## 2023-07-09 ENCOUNTER — Emergency Department (HOSPITAL_COMMUNITY)
Admission: EM | Admit: 2023-07-09 | Discharge: 2023-07-09 | Discharge status: Against Medical Advice | Payer: BC Managed Care – PPO | Attending: Emergency Medicine | Admitting: Emergency Medicine

## 2023-07-09 ENCOUNTER — Other Ambulatory Visit: Payer: Self-pay

## 2023-07-09 ENCOUNTER — Encounter (HOSPITAL_COMMUNITY): Payer: Self-pay

## 2023-07-09 DIAGNOSIS — Z5321 Procedure and treatment not carried out due to patient leaving prior to being seen by health care provider: Secondary | ICD-10-CM | POA: Insufficient documentation

## 2023-07-09 DIAGNOSIS — R112 Nausea with vomiting, unspecified: Secondary | ICD-10-CM | POA: Insufficient documentation

## 2023-07-09 LAB — URINALYSIS, ROUTINE W REFLEX MICROSCOPIC
Bilirubin Urine: NEGATIVE
Glucose, UA: NEGATIVE mg/dL
Hgb urine dipstick: NEGATIVE
Ketones, ur: NEGATIVE mg/dL
Leukocytes,Ua: NEGATIVE
Nitrite: NEGATIVE
Protein, ur: 30 mg/dL — AB
Specific Gravity, Urine: 1.025 (ref 1.005–1.030)
pH: 7 (ref 5.0–8.0)

## 2023-07-09 LAB — CBC WITH DIFFERENTIAL/PLATELET
Abs Immature Granulocytes: 0.01 10*3/uL (ref 0.00–0.07)
Basophils Absolute: 0 10*3/uL (ref 0.0–0.1)
Basophils Relative: 1 %
Eosinophils Absolute: 0.2 10*3/uL (ref 0.0–0.5)
Eosinophils Relative: 5 %
HCT: 44.4 % (ref 39.0–52.0)
Hemoglobin: 14.7 g/dL (ref 13.0–17.0)
Immature Granulocytes: 0 %
Lymphocytes Relative: 33 %
Lymphs Abs: 1.5 10*3/uL (ref 0.7–4.0)
MCH: 29.8 pg (ref 26.0–34.0)
MCHC: 33.1 g/dL (ref 30.0–36.0)
MCV: 89.9 fL (ref 80.0–100.0)
Monocytes Absolute: 0.4 10*3/uL (ref 0.1–1.0)
Monocytes Relative: 8 %
Neutro Abs: 2.4 10*3/uL (ref 1.7–7.7)
Neutrophils Relative %: 53 %
Platelets: 219 10*3/uL (ref 150–400)
RBC: 4.94 MIL/uL (ref 4.22–5.81)
RDW: 11.7 % (ref 11.5–15.5)
WBC: 4.6 10*3/uL (ref 4.0–10.5)
nRBC: 0 % (ref 0.0–0.2)

## 2023-07-09 LAB — COMPREHENSIVE METABOLIC PANEL
ALT: 39 U/L (ref 0–44)
AST: 34 U/L (ref 15–41)
Albumin: 4.7 g/dL (ref 3.5–5.0)
Alkaline Phosphatase: 50 U/L (ref 38–126)
Anion gap: 8 (ref 5–15)
BUN: 12 mg/dL (ref 6–20)
CO2: 25 mmol/L (ref 22–32)
Calcium: 9.3 mg/dL (ref 8.9–10.3)
Chloride: 102 mmol/L (ref 98–111)
Creatinine, Ser: 0.99 mg/dL (ref 0.61–1.24)
GFR, Estimated: >60 mL/min (ref >60)
Glucose, Bld: 92 mg/dL (ref 70–99)
Potassium: 3.8 mmol/L (ref 3.5–5.1)
Sodium: 135 mmol/L (ref 135–145)
Total Bilirubin: 0.7 mg/dL (ref 0.0–1.2)
Total Protein: 8.2 g/dL — ABNORMAL HIGH (ref 6.5–8.1)

## 2023-07-09 LAB — LIPASE, BLOOD: Lipase: 37 U/L (ref 11–51)

## 2023-07-09 NOTE — ED Provider Notes (Signed)
 Patient left without being seen.    Alvira Monday, MD 07/09/23 [phone]

## 2023-07-09 NOTE — ED Notes (Signed)
 Patient unable to provide urine sample

## 2023-07-09 NOTE — ED Triage Notes (Signed)
 Pt states that he was drinking last night and is vomiting.
